# Patient Record
Sex: Male | Born: 1995 | Race: White | Hispanic: No | Marital: Single | Smoking: Current every day smoker
Health system: Southern US, Community
[De-identification: ages and names within clinical notes are randomized; demographics above are authoritative.]

---
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
## 2014-05-15 ENCOUNTER — Emergency Department (HOSPITAL_COMMUNITY)
Admission: EM | Admit: 2014-05-15 | Discharge: 2014-05-15 | Disposition: A | Discharge status: Home / Self Care | Payer: BC Managed Care – PPO | Attending: Emergency Medicine | Admitting: Emergency Medicine

## 2014-05-15 ENCOUNTER — Encounter (HOSPITAL_COMMUNITY): Payer: Self-pay | Admitting: Emergency Medicine

## 2014-05-15 DIAGNOSIS — R51 Headache: Secondary | ICD-10-CM | POA: Insufficient documentation

## 2014-05-15 DIAGNOSIS — R200 Anesthesia of skin: Secondary | ICD-10-CM

## 2014-05-15 DIAGNOSIS — R209 Unspecified disturbances of skin sensation: Secondary | ICD-10-CM | POA: Insufficient documentation

## 2014-05-15 DIAGNOSIS — W010XXA Fall on same level from slipping, tripping and stumbling without subsequent striking against object, initial encounter: Secondary | ICD-10-CM | POA: Insufficient documentation

## 2014-05-15 DIAGNOSIS — Z888 Allergy status to other drugs, medicaments and biological substances status: Secondary | ICD-10-CM | POA: Insufficient documentation

## 2014-05-15 DIAGNOSIS — F17200 Nicotine dependence, unspecified, uncomplicated: Secondary | ICD-10-CM | POA: Insufficient documentation

## 2014-05-15 DIAGNOSIS — Y9301 Activity, walking, marching and hiking: Secondary | ICD-10-CM | POA: Insufficient documentation

## 2014-05-15 DIAGNOSIS — Y92009 Unspecified place in unspecified non-institutional (private) residence as the place of occurrence of the external cause: Secondary | ICD-10-CM | POA: Insufficient documentation

## 2014-05-15 NOTE — ED Provider Notes (Addendum)
CSN: [id number]     Arrival date & time 05/15/14  0604 History   First MD Initiated Contact with Patient 05/15/14 [phone]     Chief Complaint  Patient presents with  . Head Injury     HPI  Patient presents with a fall and strike his face. He was walking in his home last night. He had his hands and Streptococcus. He tripped over his cat and fell forward. He struck his face against the ground. He states that his friend  was by his side within a few seconds. He was aware of this. No frank loss of consciousness.  No nausea. No vomiting. No blurred or double vision. He states he feels numb in the left side of his face below his eye against his nose and above his lip. Also known to his left upper. No dental trauma or malocclusion. No blood from ears nose or mouth. Minimal headache. No jaw pain, pain with mastication, or facial pain.  History reviewed. No pertinent past medical history. No past surgical history on file. No family history on file. History  Substance Use Topics  . Smoking status: Current Every Day Smoker  . Smokeless tobacco: Not on file  . Alcohol Use: Yes     Comment: social     Review of Systems  Constitutional: Negative for fever, chills, diaphoresis, appetite change and fatigue.  HENT: Negative for mouth sores, sore throat and trouble swallowing.        Left mid face numbness.  Eyes: Negative for visual disturbance.  Respiratory: Negative for cough, chest tightness, shortness of breath and wheezing.   Cardiovascular: Negative for chest pain.  Gastrointestinal: Negative for nausea, vomiting, abdominal pain, diarrhea and abdominal distention.  Endocrine: Negative for polydipsia, polyphagia and polyuria.  Genitourinary: Negative for dysuria, frequency and hematuria.  Musculoskeletal: Negative for gait problem.  Skin: Negative for color change, pallor and rash.  Neurological: Positive for numbness and headaches. Negative for dizziness, syncope and light-headedness.    Hematological: Does not bruise/bleed easily.  Psychiatric/Behavioral: Negative for behavioral problems and confusion.      Allergies  Advil  Home Medications   Prior to Admission medications   Not on File   BP 110/66  Pulse 68  Temp(Src) 98.7 F (37.1 C) (Oral)  Resp 16  Wt 170 lb (77.111 kg)  SpO2 100% Physical Exam  Constitutional: He is oriented to person, place, and time. He appears well-developed and well-nourished. No distress.  HENT:  Head: Normocephalic.    Eyes: Conjunctivae are normal. Pupils are equal, round, and reactive to light. No scleral icterus.  Neck: Normal range of motion. Neck supple. No thyromegaly present.  Cardiovascular: Normal rate and regular rhythm.  Exam reveals no gallop and no friction rub.   No murmur heard. Pulmonary/Chest: Effort normal and breath sounds normal. No respiratory distress. He has no wheezes. He has no rales.  Abdominal: Soft. Bowel sounds are normal. He exhibits no distension. There is no tenderness. There is no rebound.  Musculoskeletal: Normal range of motion.  Neurological: He is alert and oriented to person, place, and time.  Normal symmetric Strength to shoulder shrug, triceps, biceps, grip,wrist flex/extend,and intrinsics  Norma lsymmetric sensation above and below clavicles, and to all distributions to UEs. Norma symmetric strength to flex/.extend hip and knees, dorsi/plantar flex ankles. Normal symmetric sensation to all distributions to LEs Patellar and achilles reflexes 1-2+. Downgoing Babinski   Skin: Skin is warm and dry. No rash noted.  Psychiatric: He has a normal  mood and affect. His behavior is normal.    ED Course  Procedures (including critical care time) Labs Review Labs Reviewed - No data to display  Imaging Review No results found.   EKG Interpretation None      MDM   Final diagnoses:  Numbness    No indications for CNS imaging. No significant symptoms to suggest close head injury.  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 No indications or signs of fracture the orbit, zygoma, or maxilla. Appropriate for discharge. Motrin when necessary.  The neuropraxia should resolve spontaneously.    Rolland PorterMark James, MD 05/15/14 [id number]  Rolland PorterMark James, MD 05/15/14 [phone]

## 2014-05-15 NOTE — Discharge Instructions (Signed)
The numbness in your face is from a bruise of your infraorbital nerve. This is called a neuropraxia.  And numbness in your face, and gum, will resolve spontaneously. This can take several weeks.  Return to the emergency room with any additional concussion symptoms. Worsening headache, vomiting, confusion. Or other changes

## 2014-05-15 NOTE — ED Notes (Signed)
Patient states that he tripped over his cat and fell onto the floor "faceplanted" hitting his face on hardwood floors last pm at 10 he reports LOC however he slept well last night and woke up without any problems this am. He states that he has numbness to his left side of his face.

## 2014-05-15 NOTE — ED Notes (Signed)
Bed: WA22 Expected date:  Expected time:  Means of arrival:  Comments: 

## 2015-03-31 ENCOUNTER — Emergency Department (HOSPITAL_COMMUNITY)
Admission: EM | Admit: 2015-03-31 | Discharge: 2015-04-01 | Disposition: A | Discharge status: Home / Self Care | Payer: BLUE CROSS/BLUE SHIELD | Attending: Emergency Medicine | Admitting: Emergency Medicine

## 2015-03-31 ENCOUNTER — Encounter (HOSPITAL_COMMUNITY): Payer: Self-pay | Admitting: Emergency Medicine

## 2015-03-31 ENCOUNTER — Emergency Department (HOSPITAL_COMMUNITY): Payer: BLUE CROSS/BLUE SHIELD

## 2015-03-31 DIAGNOSIS — F419 Anxiety disorder, unspecified: Secondary | ICD-10-CM | POA: Insufficient documentation

## 2015-03-31 DIAGNOSIS — R0602 Shortness of breath: Secondary | ICD-10-CM

## 2015-03-31 DIAGNOSIS — Z720 Tobacco use: Secondary | ICD-10-CM | POA: Insufficient documentation

## 2015-03-31 DIAGNOSIS — R200 Anesthesia of skin: Secondary | ICD-10-CM | POA: Insufficient documentation

## 2015-03-31 DIAGNOSIS — Z79899 Other long term (current) drug therapy: Secondary | ICD-10-CM | POA: Insufficient documentation

## 2015-03-31 MED ORDER — LORAZEPAM 1 MG PO TABS
1.0000 mg | ORAL_TABLET | Freq: Once | ORAL | Status: AC
  Administered 2015-03-31: 1 mg via ORAL
  Filled 2015-03-31: qty 1

## 2015-03-31 NOTE — ED Provider Notes (Signed)
CSN: [id number]     Arrival date & time 03/31/15  2103 History   First MD Initiated Contact with Patient 03/31/15 2247     Chief Complaint  Patient presents with  . Shortness of Breath  . Numbness     (Consider location/radiation/quality/duration/timing/severity/associated sxs/prior Treatment) HPI Comments: Patient with no pertinent past medical history presents to the emergency department with chief complaint of shortness of breath, and some chest tightness. Patient also states that he had a brief episode of left-sided numbness, and also states that he had a brief episode of calf pain earlier today. States that he just finished finals. States that he has been not sleeping well because of finals. States that he has been drinking "a lot of coffee and several Red Bulls per day because of finals." He states that most of his symptoms resolved. He states that he googled his symptoms, and wanted to be certain he does not have a blood clot or panic attack.  The history is provided by the patient. No language interpreter was used.    History reviewed. No pertinent past medical history. History reviewed. No pertinent past surgical history. No family history on file. History  Substance Use Topics  . Smoking status: Current Every Day Smoker    Types: Cigarettes  . Smokeless tobacco: Not on file  . Alcohol Use: Yes    Review of Systems  Constitutional: Negative for fever and chills.  Respiratory: Negative for shortness of breath.   Cardiovascular: Negative for chest pain.  Gastrointestinal: Negative for nausea, vomiting, diarrhea and constipation.  Genitourinary: Negative for dysuria.  All other systems reviewed and are negative.     Allergies  Advil  Home Medications   Prior to Admission medications   Medication Sig Start Date End Date Taking? Authorizing Provider  cetirizine (ZYRTEC) 10 MG tablet Take 10 mg by mouth daily.   Yes Historical Provider, MD   BP 118/77 mmHg  Pulse 68   Temp(Src) 98.2 F (36.8 C) (Oral)  Resp 14  SpO2 99% Physical Exam  Constitutional: He is oriented to person, place, and time. He appears well-developed and well-nourished.  HENT:  Head: Normocephalic and atraumatic.  Eyes: Conjunctivae and EOM are normal. Pupils are equal, round, and reactive to light. Right eye exhibits no discharge. Left eye exhibits no discharge. No scleral icterus.  Neck: Normal range of motion. Neck supple. No JVD present.  Cardiovascular: Normal rate, regular rhythm and normal heart sounds.  Exam reveals no gallop and no friction rub.   No murmur heard. Pulmonary/Chest: Effort normal and breath sounds normal. No respiratory distress. He has no wheezes. He has no rales. He exhibits no tenderness.  Clear to auscultation bilaterally  Abdominal: Soft. He exhibits no distension and no mass. There is no tenderness. There is no rebound and no guarding.  Musculoskeletal: Normal range of motion. He exhibits no edema or tenderness.  No unilateral calf tenderness or leg swelling but no evidence of deep or septic joint  Neurological: He is alert and oriented to person, place, and time.  Skin: Skin is warm and dry.  Psychiatric: He has a normal mood and affect. His behavior is normal. Judgment and thought content normal.  Nursing note and vitals reviewed.   ED Course  Procedures (including critical care time) Labs Review Labs Reviewed - No data to display  Imaging Review No results found.   EKG Interpretation   Date/Time:  Friday Mar 31 2015 23:26:56 EDT Ventricular Rate:  63 PR Interval:  122 QRS Duration: 93 QT Interval:  396 QTC Calculation: 405 R Axis:   105 Text Interpretation:  Sinus rhythm Borderline right axis deviation ST  elev, probable normal early repol pattern Previous EKG was Pediatric EKG  Confirmed by [facility] Center[facility]CCMANUS  MD, Nicholos JohnsKATHLEEN [phone]([id number]) on 03/31/2015 11:39:58 PM      MDM   Final diagnoses:  SOB (shortness of breath)  Anxiety    Patient with  shortness of breath chest tightness, also with some numbness. Suspect anxiety versus panic attack. Additionally, patient has drank multiple glasses of coffee and energy drinks today and over the past several days since he is been studying for finals. He also has not been sleeping normally because of finals. Suspect that these are all contributing factors. Patient has no cardiac or PE risk factors. He is well-appearing, not in any apparent distress. EKG and chest x-ray are reassuring. Discussed with Dr. Clarene DukeMcManus, who agrees with plan for discharge.    Roxy Horsemanobert Jolee Critcher, PA-C 04/01/15 0008  Samuel JesterKathleen McManus, DO 04/02/15 1623

## 2015-03-31 NOTE — ED Notes (Signed)
Pt returned from X-ray.  

## 2015-03-31 NOTE — ED Notes (Signed)
Pt c/o sob and numbness to L side of body that started 1 1/2 ago while looking on WebMD to see if he had a blood clot.  Pt reports dull pain to L calf that started this morning.  States he may have had a panic attack while looking at WebMD.  No pain at present.  No neuro deficits noted on triage exam.

## 2015-04-01 NOTE — Discharge Instructions (Signed)
Panic Attacks Panic attacks are sudden, short feelings of great fear or discomfort. You may have them for no reason when you are relaxed, when you are uneasy (anxious), or when you are sleeping.  HOME CARE  Take all your medicines as told.  Check with your doctor before starting new medicines.  Keep all doctor visits. GET HELP IF:  You are not able to take your medicines as told.  Your symptoms do not get better.  Your symptoms get worse. GET HELP RIGHT AWAY IF:  Your attacks seem different than your normal attacks.  You have thoughts about hurting yourself or others.  You take panic attack medicine and you have a side effect. MAKE SURE YOU:  Understand these instructions.  Will watch your condition.  Will get help right away if you are not doing well or get worse. Document Released: 12/14/2010 Document Revised: 09/01/2013 Document Reviewed: 06/25/2013 [facility] Patient Information 2015 [place], [place]. This information is not intended to replace advice given to you by your health care provider. Make sure you discuss any questions you have with your health care provider. Chest Pain (Nonspecific) It is often hard to give a specific diagnosis for the cause of chest pain. There is always a chance that your pain could be related to something serious, such as a heart attack or a blood clot in the lungs. You need to follow up with your health care provider for further evaluation. CAUSES   Heartburn.  Pneumonia or bronchitis.  Anxiety or stress.  Inflammation around your heart (pericarditis) or lung (pleuritis or pleurisy).  A blood clot in the lung.  A collapsed lung (pneumothorax). It can develop suddenly on its own (spontaneous pneumothorax) or from trauma to the chest.  Shingles infection (herpes zoster virus). The chest wall is composed of bones, muscles, and cartilage. Any of these can be the source of the pain.  The bones can be bruised by injury.  The muscles or  cartilage can be strained by coughing or overwork.  The cartilage can be affected by inflammation and become sore (costochondritis). DIAGNOSIS  Lab tests or other studies may be needed to find the cause of your pain. Your health care provider may have you take a test called an ambulatory electrocardiogram (ECG). An ECG records your heartbeat patterns over a 24-hour period. You may also have other tests, such as:  Transthoracic echocardiogram (TTE). During echocardiography, sound waves are used to evaluate how blood flows through your heart.  Transesophageal echocardiogram (TEE).  Cardiac monitoring. This allows your health care provider to monitor your heart rate and rhythm in real time.  Holter monitor. This is a portable device that records your heartbeat and can help diagnose heart arrhythmias. It allows your health care provider to track your heart activity for several days, if needed.  Stress tests by exercise or by giving medicine that makes the heart beat faster. TREATMENT   Treatment depends on what may be causing your chest pain. Treatment may include:  Acid blockers for heartburn.  Anti-inflammatory medicine.  Pain medicine for inflammatory conditions.  Antibiotics if an infection is present.  You may be advised to change lifestyle habits. This includes stopping smoking and avoiding alcohol, caffeine, and chocolate.  You may be advised to keep your head raised (elevated) when sleeping. This reduces the chance of acid going backward from your stomach into your esophagus. Most of the time, nonspecific chest pain will improve within 2-3 days with rest and mild pain medicine.  HOME CARE  INSTRUCTIONS   If antibiotics were prescribed, take them as directed. Finish them even if you start to feel better.  For the next few days, avoid physical activities that bring on chest pain. Continue physical activities as directed.  Do not use any tobacco products, including cigarettes,  chewing tobacco, or electronic cigarettes.  Avoid drinking alcohol.  Only take medicine as directed by your health care provider.  Follow your health care provider's suggestions for further testing if your chest pain does not go away.  Keep any follow-up appointments you made. If you do not go to an appointment, you could develop lasting (chronic) problems with pain. If there is any problem keeping an appointment, call to reschedule. SEEK MEDICAL CARE IF:   Your chest pain does not go away, even after treatment.  You have a rash with blisters on your chest.  You have a fever. SEEK IMMEDIATE MEDICAL CARE IF:   You have increased chest pain or pain that spreads to your arm, neck, jaw, back, or abdomen.  You have shortness of breath.  You have an increasing cough, or you cough up blood.  You have severe back or abdominal pain.  You feel nauseous or vomit.  You have severe weakness.  You faint.  You have chills. This is an emergency. Do not wait to see if the pain will go away. Get medical help at once. Call your local emergency services (911 in [place] Do not drive yourself to the hospital. MAKE SURE YOU:   Understand these instructions.  Will watch your condition.  Will get help right away if you are not doing well or get worse. Document Released: 08/21/2005 Document Revised: 11/16/2013 Document Reviewed: 06/16/2008 [facility] Patient Information 2015 [place], [place]. This information is not intended to replace advice given to you by your health care provider. Make sure you discuss any questions you have with your health care provider.

## 2017-01-17 ENCOUNTER — Ambulatory Visit (INDEPENDENT_AMBULATORY_CARE_PROVIDER_SITE_OTHER): Payer: BLUE CROSS/BLUE SHIELD | Admitting: Psychology

## 2017-01-17 DIAGNOSIS — F4323 Adjustment disorder with mixed anxiety and depressed mood: Secondary | ICD-10-CM | POA: Diagnosis not present

## 2017-01-17 DIAGNOSIS — F19129 Other psychoactive substance abuse with intoxication, unspecified: Secondary | ICD-10-CM | POA: Diagnosis not present

## 2017-02-06 ENCOUNTER — Ambulatory Visit (INDEPENDENT_AMBULATORY_CARE_PROVIDER_SITE_OTHER): Payer: BLUE CROSS/BLUE SHIELD | Admitting: Psychology

## 2017-02-06 DIAGNOSIS — F19129 Other psychoactive substance abuse with intoxication, unspecified: Secondary | ICD-10-CM

## 2017-02-06 DIAGNOSIS — F4323 Adjustment disorder with mixed anxiety and depressed mood: Secondary | ICD-10-CM | POA: Diagnosis not present

## 2017-02-13 ENCOUNTER — Ambulatory Visit: Payer: Self-pay | Admitting: Psychology

## 2017-02-28 DIAGNOSIS — Z0001 Encounter for general adult medical examination with abnormal findings: Secondary | ICD-10-CM | POA: Diagnosis not present

## 2017-03-05 ENCOUNTER — Ambulatory Visit: Payer: Self-pay | Admitting: Psychology

## 2017-04-08 ENCOUNTER — Ambulatory Visit: Payer: BLUE CROSS/BLUE SHIELD | Admitting: Psychology

## 2017-05-02 DIAGNOSIS — L218 Other seborrheic dermatitis: Secondary | ICD-10-CM | POA: Diagnosis not present

## 2017-05-02 DIAGNOSIS — R21 Rash and other nonspecific skin eruption: Secondary | ICD-10-CM | POA: Diagnosis not present

## 2017-05-19 DIAGNOSIS — E291 Testicular hypofunction: Secondary | ICD-10-CM | POA: Diagnosis not present

## 2017-05-27 ENCOUNTER — Ambulatory Visit (INDEPENDENT_AMBULATORY_CARE_PROVIDER_SITE_OTHER): Payer: BLUE CROSS/BLUE SHIELD | Admitting: Psychology

## 2017-05-27 DIAGNOSIS — F19129 Other psychoactive substance abuse with intoxication, unspecified: Secondary | ICD-10-CM | POA: Diagnosis not present

## 2017-05-27 DIAGNOSIS — F4323 Adjustment disorder with mixed anxiety and depressed mood: Secondary | ICD-10-CM

## 2017-07-20 ENCOUNTER — Encounter (HOSPITAL_COMMUNITY): Payer: Self-pay

## 2017-07-20 ENCOUNTER — Emergency Department (HOSPITAL_COMMUNITY)
Admission: EM | Admit: 2017-07-20 | Discharge: 2017-07-20 | Disposition: A | Discharge status: Home / Self Care | Payer: BLUE CROSS/BLUE SHIELD | Attending: Emergency Medicine | Admitting: Emergency Medicine

## 2017-07-20 DIAGNOSIS — Z5321 Procedure and treatment not carried out due to patient leaving prior to being seen by health care provider: Secondary | ICD-10-CM | POA: Insufficient documentation

## 2017-07-20 DIAGNOSIS — F14988 Cocaine use, unspecified with other cocaine-induced disorder: Secondary | ICD-10-CM | POA: Diagnosis present

## 2017-07-20 NOTE — ED Triage Notes (Addendum)
States at 0100 am used cocaine and was feeling weird and now states he feels better no respiratory or acute distress noted no pain voiced steady gait noted. No SI or HI voiced.

## 2017-07-20 NOTE — ED Notes (Signed)
Pt stated he did two lines and a couple bumps of cocaine tonight around 0100. He states that his head "feels full and doesn't feel very good."

## 2017-09-05 ENCOUNTER — Ambulatory Visit: Payer: Self-pay | Admitting: Psychology

## 2017-10-01 ENCOUNTER — Emergency Department (HOSPITAL_COMMUNITY)
Admission: EM | Admit: 2017-10-01 | Discharge: 2017-10-01 | Disposition: A | Discharge status: Home / Self Care | Payer: BLUE CROSS/BLUE SHIELD | Attending: Emergency Medicine | Admitting: Emergency Medicine

## 2017-10-01 ENCOUNTER — Other Ambulatory Visit: Payer: Self-pay

## 2017-10-01 ENCOUNTER — Emergency Department (HOSPITAL_COMMUNITY): Payer: BLUE CROSS/BLUE SHIELD

## 2017-10-01 ENCOUNTER — Encounter (HOSPITAL_COMMUNITY): Payer: Self-pay | Admitting: Emergency Medicine

## 2017-10-01 DIAGNOSIS — F419 Anxiety disorder, unspecified: Secondary | ICD-10-CM | POA: Diagnosis not present

## 2017-10-01 DIAGNOSIS — R51 Headache: Secondary | ICD-10-CM | POA: Insufficient documentation

## 2017-10-01 DIAGNOSIS — R202 Paresthesia of skin: Secondary | ICD-10-CM | POA: Diagnosis not present

## 2017-10-01 DIAGNOSIS — R079 Chest pain, unspecified: Secondary | ICD-10-CM | POA: Insufficient documentation

## 2017-10-01 DIAGNOSIS — Z79899 Other long term (current) drug therapy: Secondary | ICD-10-CM | POA: Diagnosis not present

## 2017-10-01 DIAGNOSIS — F17210 Nicotine dependence, cigarettes, uncomplicated: Secondary | ICD-10-CM | POA: Insufficient documentation

## 2017-10-01 LAB — BASIC METABOLIC PANEL
ANION GAP: 7 (ref 5–15)
BUN: 11 mg/dL (ref 6–20)
CHLORIDE: 105 mmol/L (ref 101–111)
CO2: 24 mmol/L (ref 22–32)
Calcium: 9.5 mg/dL (ref 8.9–10.3)
Creatinine, Ser: 0.94 mg/dL (ref 0.61–1.24)
GFR calc Af Amer: >60 mL/min (ref >60)
GLUCOSE: 86 mg/dL (ref 65–99)
POTASSIUM: 4.6 mmol/L (ref 3.5–5.1)
SODIUM: 136 mmol/L (ref 135–145)

## 2017-10-01 LAB — CBC
HEMATOCRIT: 50 % (ref 39.0–52.0)
HEMOGLOBIN: 17.2 g/dL — AB (ref 13.0–17.0)
MCH: 30.4 pg (ref 26.0–34.0)
MCHC: 34.4 g/dL (ref 30.0–36.0)
MCV: 88.3 fL (ref 78.0–100.0)
Platelets: 201 10*3/uL (ref 150–400)
RBC: 5.66 MIL/uL (ref 4.22–5.81)
RDW: 12.3 % (ref 11.5–15.5)
WBC: 6.2 10*3/uL (ref 4.0–10.5)

## 2017-10-01 LAB — I-STAT TROPONIN, ED: Troponin i, poc: 0 ng/mL (ref 0.00–0.08)

## 2017-10-01 NOTE — ED Triage Notes (Addendum)
Pt has multiple complaints of varying descriptions. Pt states for months now he has chest pain, to random areas. With headaches, like right now to right head, that he describes as a pressure. Pt also states he will randomly have numbness and tingling to any extremity, that come and go. Pt has no chest pain currently but had some last night and this morning. Pt has headache to right head.  Denies fevers, reports neck stiffness on right side of neck. Occasional abdominal pains that come and go. None right now. Diarrhea that comes and goes, none right now. Pt has numerous complaints of things that come and go. No acute distress noted.  Pt denies any medical history. Denies psychiatric history.

## 2017-10-01 NOTE — Discharge Instructions (Signed)
Please read attached information. If you experience any new or worsening signs or symptoms please return to the emergency room for evaluation. Please follow-up with your primary care provider or specialist as discussed.  °

## 2017-10-01 NOTE — ED Notes (Signed)
Pt reports he drinks daily with last drink 3 days ago. Pt also reports daily marijuana use. He c/o headaches after drinking, anxiety, and tingling in arms.

## 2017-10-01 NOTE — ED Provider Notes (Signed)
[facility]   CSN: [id number] Arrival date & time: 10/01/17  1154   History   Chief Complaint Chief Complaint  Patient presents with  . Chest Pain  . multiple complaints    HPI Gabriel Serrano is a 21 y.o. male.  HPI  21 year old male presents today with numerous complaints.  Patient reports that over the last year he has had several ongoing complaints.  He notes paresthesias that come and go in all extremities.  He notes off and on chest pain described as pressure that has been coming and going.  Patient notes a right-sided headache with pressure again coming and going.  At the time of evaluation he denies any current complaints.  Patient notes that he occasionally uses marijuana, does not have a healthy lifestyle, poor sleeping habits, and has anxiety.  Patient denies any acute complaints presently.  Reports that he called a primary care provider's office to schedule an appointment and was told to come to the emergency room.    History reviewed. No pertinent past medical history.  There are no active problems to display for this patient.   No past surgical history on file.     Home Medications    Prior to Admission medications   Medication Sig Start Date End Date Taking? Authorizing Provider  cetirizine (ZYRTEC) 10 MG tablet Take 10 mg by mouth daily.    [provider]    Family History No family history on file.  Social History Social History   Tobacco Use  . Smoking status: Current Every Day Smoker    Types: Cigarettes  . Smokeless tobacco: Never Used  Substance Use Topics  . Alcohol use: Yes  . Drug use: Yes    Types: Marijuana     Allergies   Advil [ibuprofen] and Asa [aspirin]   Review of Systems Review of Systems  All other systems reviewed and are negative.   Physical Exam Updated Vital Signs BP 131/84 (BP Location: Right Arm)   Pulse 60   Temp 98.1 F (36.7 C) (Oral)   Resp  16   SpO2 98%   Physical Exam  Constitutional: He is oriented to person, place, and time. He appears well-developed and well-nourished.  HENT:  Head: Normocephalic and atraumatic.  Eyes: Conjunctivae are normal. Pupils are equal, round, and reactive to light. Right eye exhibits no discharge. Left eye exhibits no discharge. No scleral icterus.  Neck: Normal range of motion. No JVD present. No tracheal deviation present.  Cardiovascular: Normal rate, regular rhythm, normal heart sounds and intact distal pulses.  Pulmonary/Chest: Effort normal. No stridor.  Neurological: He is alert and oriented to person, place, and time. He has normal strength. No cranial nerve deficit or sensory deficit. Coordination normal. GCS eye subscore is 4. GCS verbal subscore is 5. GCS motor subscore is 6.  Psychiatric: He has a normal mood and affect. His behavior is normal. Judgment and thought content normal.  Nursing note and vitals reviewed.    ED Treatments / Results  Labs (all labs ordered are listed, but only abnormal results are displayed) Labs Reviewed  CBC - Abnormal; Notable for the following components:      Result Value   Hemoglobin 17.2 (*)    All other components within normal limits  BASIC METABOLIC PANEL  I-STAT TROPONIN, ED    EKG  EKG Interpretation None       Radiology Dg Chest 2 View  Result Date: 10/01/2017 CLINICAL DATA:  Acute chest pain today. EXAM: CHEST  2 VIEW COMPARISON:  03/31/2015 chest radiograph FINDINGS: The cardiomediastinal silhouette is unremarkable. There is no evidence of focal airspace disease, pulmonary edema, suspicious pulmonary nodule/mass, pleural effusion, or pneumothorax. No acute bony abnormalities are identified. IMPRESSION: No active cardiopulmonary disease. Electronically Signed   By: Harmon PierJeffrey  Hu M.D.   On: 10/01/2017 13:03    Procedures Procedures (including critical care time)  Medications Ordered in ED Medications - No data to  display   Initial Impression / Assessment and Plan / ED Course  I have reviewed the triage vital signs and the nursing notes.  Pertinent labs & imaging results that were available during my care of the patient were reviewed by me and considered in my medical decision making (see chart for details).      Final Clinical Impressions(s) / ED Diagnoses   Final diagnoses:  Paresthesia  Chest pain, unspecified type    Labs:   Imaging:  Consults:  Therapeutics:  Discharge Meds:   Assessment/Plan: 21 year old male presents today with numerous complaints.  Patient is having over a year history of coming and going paresthesias that do not match any specific pattern.  Also nonspecific chest pain.  Patient is asymptomatic at the time my evaluation.  I have very low suspicion for any acute intrathoracic, intracranial abnormality.  Would like patient to follow-up with neurology for reassessment and ongoing evaluation of his ongoing symptoms.  Patient will be discharged with strict return precautions, he had no further questions or concerns at time discharge.    ED Discharge Orders    None       Eyvonne MechanicHedges, Alyssia Heese, Cordelia Poche-C 10/01/17 1831    Tegeler, Canary Brimhristopher J, MD 10/01/17 [phone]

## 2018-04-11 IMAGING — DX DG CHEST 2V
2 series · 2 of 2 positions shown · non-contrast
Comparison: 03/31/2015 chest radiograph

CLINICAL DATA: Acute chest pain today.

EXAM:
CHEST  2 VIEW

[chest pa]
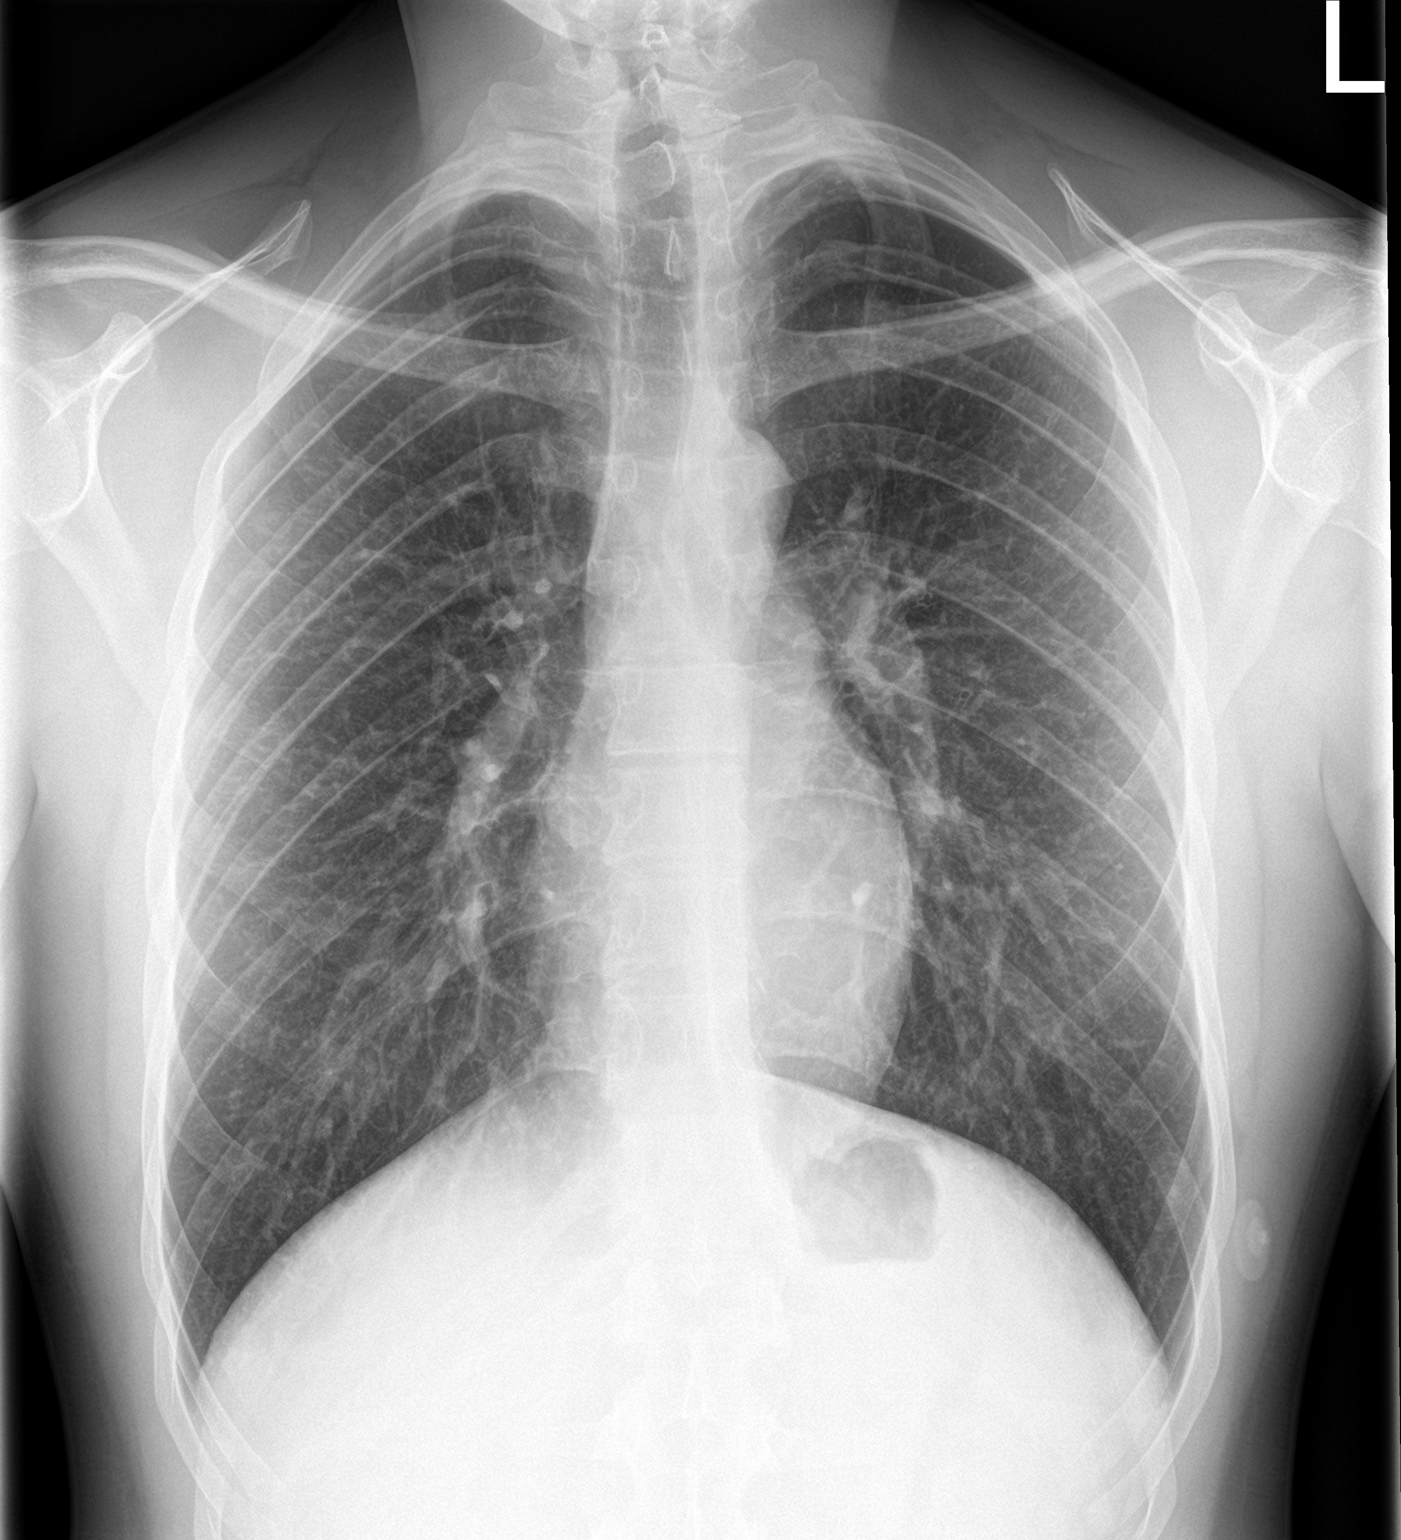

[chest lat]
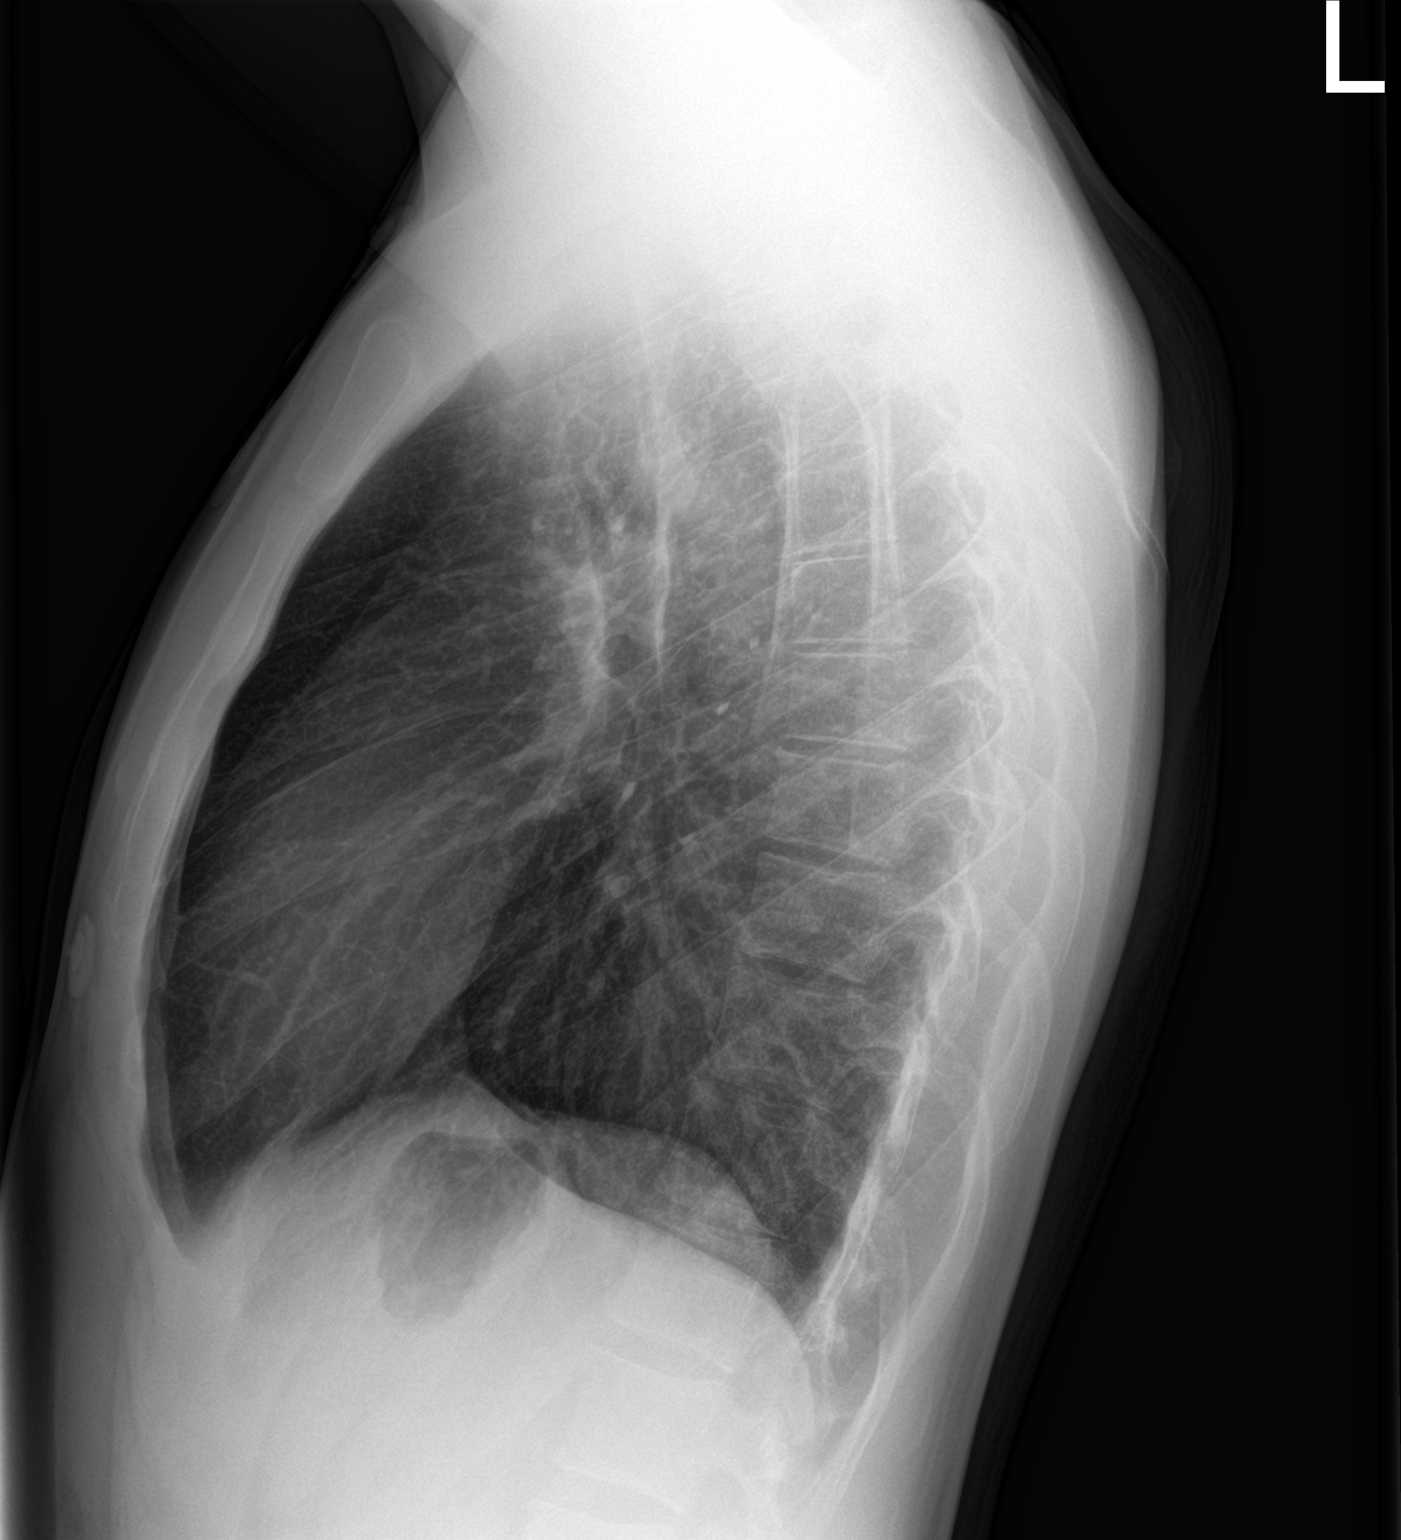

[2 of 2 positions shown; findings below may reference images not displayed]

FINDINGS: The cardiomediastinal silhouette is unremarkable.

There is no evidence of focal airspace disease, pulmonary edema,
suspicious pulmonary nodule/mass, pleural effusion, or pneumothorax.
No acute bony abnormalities are identified.
IMPRESSION: No active cardiopulmonary disease.

## 2018-09-30 DIAGNOSIS — R079 Chest pain, unspecified: Secondary | ICD-10-CM | POA: Diagnosis not present

## 2018-12-28 ENCOUNTER — Ambulatory Visit (INDEPENDENT_AMBULATORY_CARE_PROVIDER_SITE_OTHER): Payer: BLUE CROSS/BLUE SHIELD | Admitting: Psychology

## 2018-12-28 DIAGNOSIS — F19129 Other psychoactive substance abuse with intoxication, unspecified: Secondary | ICD-10-CM

## 2018-12-28 DIAGNOSIS — F4323 Adjustment disorder with mixed anxiety and depressed mood: Secondary | ICD-10-CM | POA: Diagnosis not present

## 2019-01-11 ENCOUNTER — Ambulatory Visit (INDEPENDENT_AMBULATORY_CARE_PROVIDER_SITE_OTHER): Payer: BLUE CROSS/BLUE SHIELD | Admitting: Psychology

## 2019-01-11 DIAGNOSIS — F4323 Adjustment disorder with mixed anxiety and depressed mood: Secondary | ICD-10-CM

## 2019-01-11 DIAGNOSIS — F19129 Other psychoactive substance abuse with intoxication, unspecified: Secondary | ICD-10-CM

## 2019-01-26 ENCOUNTER — Ambulatory Visit (INDEPENDENT_AMBULATORY_CARE_PROVIDER_SITE_OTHER): Payer: BLUE CROSS/BLUE SHIELD | Admitting: Psychology

## 2019-01-26 DIAGNOSIS — F4323 Adjustment disorder with mixed anxiety and depressed mood: Secondary | ICD-10-CM | POA: Diagnosis not present

## 2019-01-26 DIAGNOSIS — F19129 Other psychoactive substance abuse with intoxication, unspecified: Secondary | ICD-10-CM

## 2019-02-09 ENCOUNTER — Ambulatory Visit (INDEPENDENT_AMBULATORY_CARE_PROVIDER_SITE_OTHER): Payer: BLUE CROSS/BLUE SHIELD | Admitting: Psychology

## 2019-02-09 DIAGNOSIS — F19129 Other psychoactive substance abuse with intoxication, unspecified: Secondary | ICD-10-CM | POA: Diagnosis not present

## 2019-02-09 DIAGNOSIS — F4323 Adjustment disorder with mixed anxiety and depressed mood: Secondary | ICD-10-CM | POA: Diagnosis not present

## 2019-02-23 ENCOUNTER — Ambulatory Visit (INDEPENDENT_AMBULATORY_CARE_PROVIDER_SITE_OTHER): Payer: BLUE CROSS/BLUE SHIELD | Admitting: Psychology

## 2019-02-23 DIAGNOSIS — F331 Major depressive disorder, recurrent, moderate: Secondary | ICD-10-CM | POA: Diagnosis not present

## 2019-03-09 ENCOUNTER — Ambulatory Visit: Payer: BLUE CROSS/BLUE SHIELD | Admitting: Psychology

## 2019-03-16 ENCOUNTER — Ambulatory Visit: Payer: BLUE CROSS/BLUE SHIELD | Admitting: Psychology

## 2019-03-18 ENCOUNTER — Ambulatory Visit (INDEPENDENT_AMBULATORY_CARE_PROVIDER_SITE_OTHER): Payer: BLUE CROSS/BLUE SHIELD | Admitting: Psychology

## 2019-03-18 DIAGNOSIS — F4323 Adjustment disorder with mixed anxiety and depressed mood: Secondary | ICD-10-CM

## 2019-03-18 DIAGNOSIS — F19129 Other psychoactive substance abuse with intoxication, unspecified: Secondary | ICD-10-CM

## 2019-03-24 ENCOUNTER — Ambulatory Visit (INDEPENDENT_AMBULATORY_CARE_PROVIDER_SITE_OTHER): Payer: BLUE CROSS/BLUE SHIELD | Admitting: Psychology

## 2019-03-24 DIAGNOSIS — F4323 Adjustment disorder with mixed anxiety and depressed mood: Secondary | ICD-10-CM | POA: Diagnosis not present

## 2019-03-24 DIAGNOSIS — F19129 Other psychoactive substance abuse with intoxication, unspecified: Secondary | ICD-10-CM

## 2019-03-30 ENCOUNTER — Ambulatory Visit (INDEPENDENT_AMBULATORY_CARE_PROVIDER_SITE_OTHER): Payer: BLUE CROSS/BLUE SHIELD | Admitting: Psychology

## 2019-03-30 DIAGNOSIS — F19129 Other psychoactive substance abuse with intoxication, unspecified: Secondary | ICD-10-CM

## 2019-03-30 DIAGNOSIS — F4323 Adjustment disorder with mixed anxiety and depressed mood: Secondary | ICD-10-CM

## 2019-04-07 ENCOUNTER — Ambulatory Visit (INDEPENDENT_AMBULATORY_CARE_PROVIDER_SITE_OTHER): Payer: BLUE CROSS/BLUE SHIELD | Admitting: Psychology

## 2019-04-07 DIAGNOSIS — F4323 Adjustment disorder with mixed anxiety and depressed mood: Secondary | ICD-10-CM

## 2019-04-07 DIAGNOSIS — F19129 Other psychoactive substance abuse with intoxication, unspecified: Secondary | ICD-10-CM

## 2019-04-15 ENCOUNTER — Ambulatory Visit (INDEPENDENT_AMBULATORY_CARE_PROVIDER_SITE_OTHER): Payer: BLUE CROSS/BLUE SHIELD | Admitting: Psychology

## 2019-04-15 DIAGNOSIS — F4323 Adjustment disorder with mixed anxiety and depressed mood: Secondary | ICD-10-CM | POA: Diagnosis not present

## 2019-04-15 DIAGNOSIS — F19129 Other psychoactive substance abuse with intoxication, unspecified: Secondary | ICD-10-CM | POA: Diagnosis not present

## 2019-11-22 DIAGNOSIS — Z20828 Contact with and (suspected) exposure to other viral communicable diseases: Secondary | ICD-10-CM | POA: Diagnosis not present

## 2020-07-12 DIAGNOSIS — Z20822 Contact with and (suspected) exposure to covid-19: Secondary | ICD-10-CM | POA: Diagnosis not present

## 2021-04-09 DIAGNOSIS — S62306A Unspecified fracture of fifth metacarpal bone, right hand, initial encounter for closed fracture: Secondary | ICD-10-CM | POA: Diagnosis not present

## 2021-04-09 DIAGNOSIS — W2209XA Striking against other stationary object, initial encounter: Secondary | ICD-10-CM | POA: Diagnosis not present

## 2021-04-09 DIAGNOSIS — S62326A Displaced fracture of shaft of fifth metacarpal bone, right hand, initial encounter for closed fracture: Secondary | ICD-10-CM | POA: Diagnosis not present

## 2021-04-12 DIAGNOSIS — S62326A Displaced fracture of shaft of fifth metacarpal bone, right hand, initial encounter for closed fracture: Secondary | ICD-10-CM | POA: Diagnosis not present

## 2021-04-24 DIAGNOSIS — S62326A Displaced fracture of shaft of fifth metacarpal bone, right hand, initial encounter for closed fracture: Secondary | ICD-10-CM | POA: Diagnosis not present

## 2021-05-03 DIAGNOSIS — S62306D Unspecified fracture of fifth metacarpal bone, right hand, subsequent encounter for fracture with routine healing: Secondary | ICD-10-CM | POA: Diagnosis not present

## 2021-05-03 DIAGNOSIS — M25641 Stiffness of right hand, not elsewhere classified: Secondary | ICD-10-CM | POA: Diagnosis not present

## 2021-05-10 DIAGNOSIS — S62306D Unspecified fracture of fifth metacarpal bone, right hand, subsequent encounter for fracture with routine healing: Secondary | ICD-10-CM | POA: Diagnosis not present

## 2021-05-10 DIAGNOSIS — M25641 Stiffness of right hand, not elsewhere classified: Secondary | ICD-10-CM | POA: Diagnosis not present

## 2021-05-15 DIAGNOSIS — S62306D Unspecified fracture of fifth metacarpal bone, right hand, subsequent encounter for fracture with routine healing: Secondary | ICD-10-CM | POA: Diagnosis not present

## 2021-05-15 DIAGNOSIS — M25641 Stiffness of right hand, not elsewhere classified: Secondary | ICD-10-CM | POA: Diagnosis not present

## 2021-05-17 DIAGNOSIS — S62306D Unspecified fracture of fifth metacarpal bone, right hand, subsequent encounter for fracture with routine healing: Secondary | ICD-10-CM | POA: Diagnosis not present

## 2021-05-17 DIAGNOSIS — M25641 Stiffness of right hand, not elsewhere classified: Secondary | ICD-10-CM | POA: Diagnosis not present

## 2021-05-22 DIAGNOSIS — S62306D Unspecified fracture of fifth metacarpal bone, right hand, subsequent encounter for fracture with routine healing: Secondary | ICD-10-CM | POA: Diagnosis not present

## 2021-05-22 DIAGNOSIS — M25641 Stiffness of right hand, not elsewhere classified: Secondary | ICD-10-CM | POA: Diagnosis not present

## 2021-05-22 DIAGNOSIS — S62326D Displaced fracture of shaft of fifth metacarpal bone, right hand, subsequent encounter for fracture with routine healing: Secondary | ICD-10-CM | POA: Diagnosis not present

## 2021-05-24 DIAGNOSIS — S62306D Unspecified fracture of fifth metacarpal bone, right hand, subsequent encounter for fracture with routine healing: Secondary | ICD-10-CM | POA: Diagnosis not present

## 2021-05-24 DIAGNOSIS — M25641 Stiffness of right hand, not elsewhere classified: Secondary | ICD-10-CM | POA: Diagnosis not present

## 2021-05-29 DIAGNOSIS — M25641 Stiffness of right hand, not elsewhere classified: Secondary | ICD-10-CM | POA: Diagnosis not present

## 2021-05-29 DIAGNOSIS — S62306D Unspecified fracture of fifth metacarpal bone, right hand, subsequent encounter for fracture with routine healing: Secondary | ICD-10-CM | POA: Diagnosis not present

## 2021-05-31 DIAGNOSIS — S62306D Unspecified fracture of fifth metacarpal bone, right hand, subsequent encounter for fracture with routine healing: Secondary | ICD-10-CM | POA: Diagnosis not present

## 2021-05-31 DIAGNOSIS — M25641 Stiffness of right hand, not elsewhere classified: Secondary | ICD-10-CM | POA: Diagnosis not present

## 2021-06-14 DIAGNOSIS — S62306D Unspecified fracture of fifth metacarpal bone, right hand, subsequent encounter for fracture with routine healing: Secondary | ICD-10-CM | POA: Diagnosis not present

## 2021-06-14 DIAGNOSIS — M25641 Stiffness of right hand, not elsewhere classified: Secondary | ICD-10-CM | POA: Diagnosis not present

## 2021-06-26 DIAGNOSIS — S62326D Displaced fracture of shaft of fifth metacarpal bone, right hand, subsequent encounter for fracture with routine healing: Secondary | ICD-10-CM | POA: Diagnosis not present
